# Patient Record
Sex: Male | Born: 1975 | Race: White | Hispanic: No | Marital: Single | Smoking: Current every day smoker
Health system: Southern US, Community
[De-identification: ages and names within clinical notes are randomized; demographics above are authoritative.]

## PROBLEM LIST (undated history)

## (undated) DIAGNOSIS — T7840XA Allergy, unspecified, initial encounter: Secondary | ICD-10-CM

## (undated) DIAGNOSIS — J45909 Unspecified asthma, uncomplicated: Secondary | ICD-10-CM

---

## 1998-11-15 ENCOUNTER — Encounter: Payer: Self-pay | Admitting: Emergency Medicine

## 1998-11-15 ENCOUNTER — Emergency Department (HOSPITAL_COMMUNITY): Admission: EM | Admit: 1998-11-15 | Discharge: 1998-11-15 | Payer: Self-pay | Admitting: Emergency Medicine

## 2002-09-18 ENCOUNTER — Encounter: Payer: Self-pay | Admitting: Emergency Medicine

## 2002-09-18 ENCOUNTER — Emergency Department (HOSPITAL_COMMUNITY): Admission: EM | Admit: 2002-09-18 | Discharge: 2002-09-18 | Payer: Self-pay | Admitting: Emergency Medicine

## 2013-09-30 ENCOUNTER — Other Ambulatory Visit: Payer: Self-pay | Admitting: Family Medicine

## 2013-09-30 ENCOUNTER — Ambulatory Visit
Admission: RE | Admit: 2013-09-30 | Discharge: 2013-09-30 | Disposition: A | Discharge status: Home / Self Care | Payer: BC Managed Care – PPO | Source: Ambulatory Visit | Attending: Family Medicine | Admitting: Family Medicine

## 2013-09-30 DIAGNOSIS — M5432 Sciatica, left side: Secondary | ICD-10-CM

## 2014-06-02 IMAGING — CR DG LUMBAR SPINE COMPLETE 4+V
5 series · 5 of 5 positions shown · non-contrast
Comparison: None.

CLINICAL DATA: Back pain.

EXAM:
LUMBAR SPINE - COMPLETE 4+ VIEW

[t l-spine a.p.]
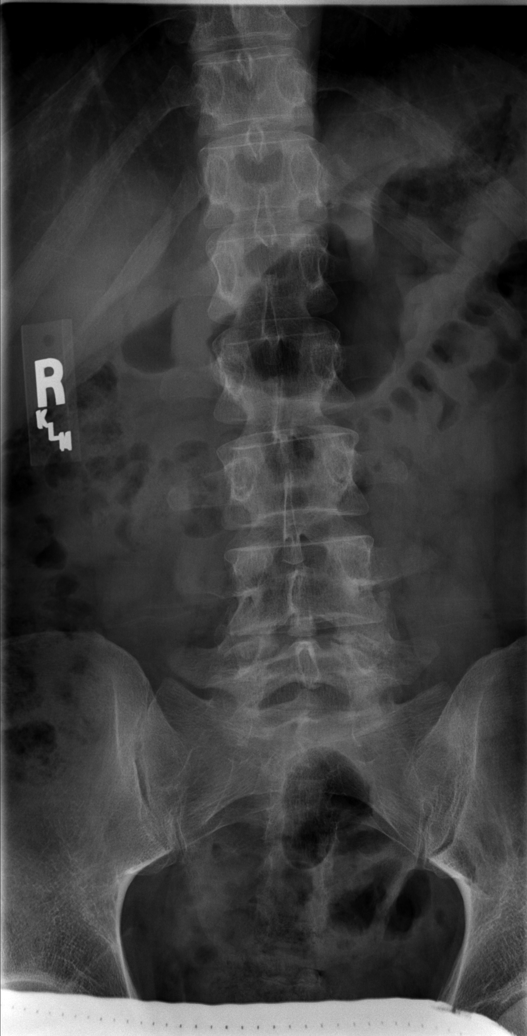

[t l-spine oblique exposure (1 of 2)]
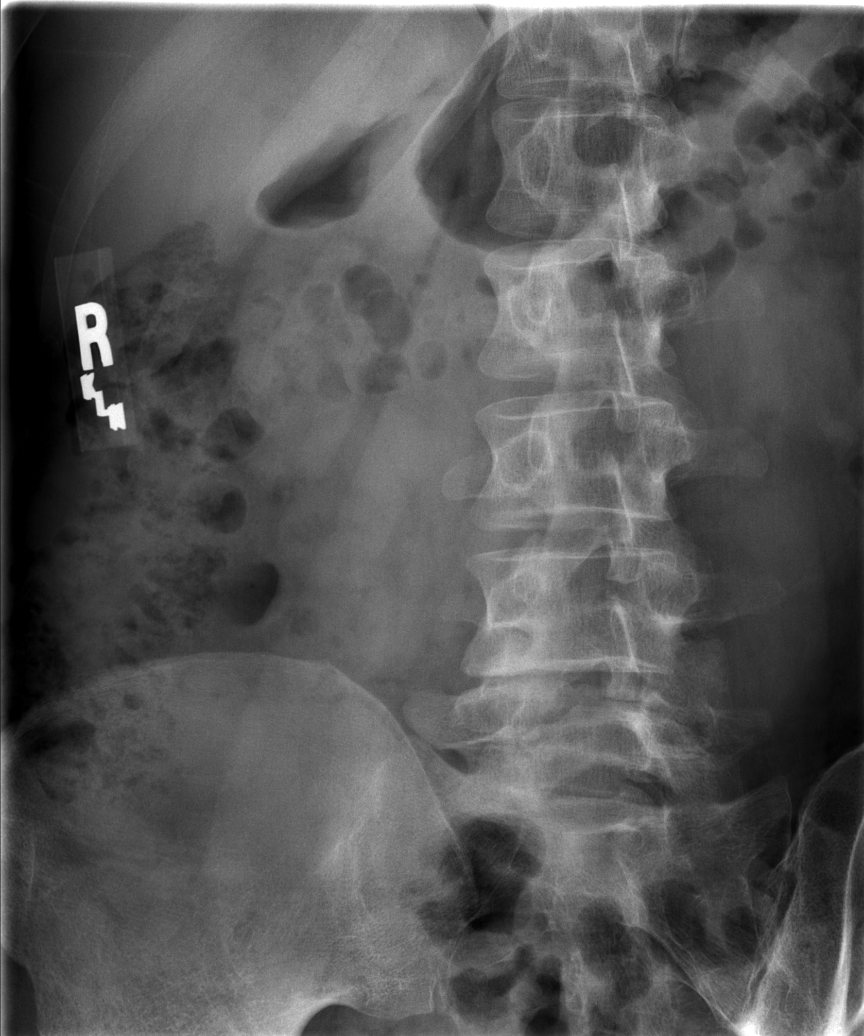

[t l-spine oblique exposure (2 of 2)]
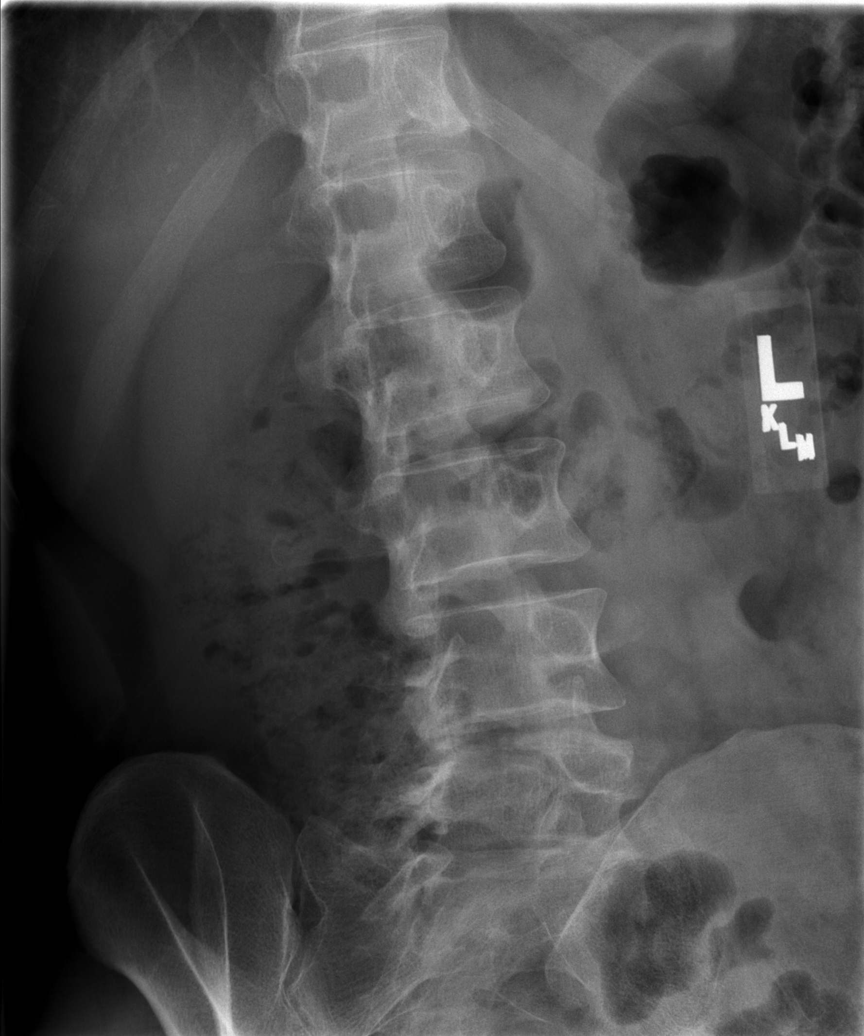

[t l-spine lat]
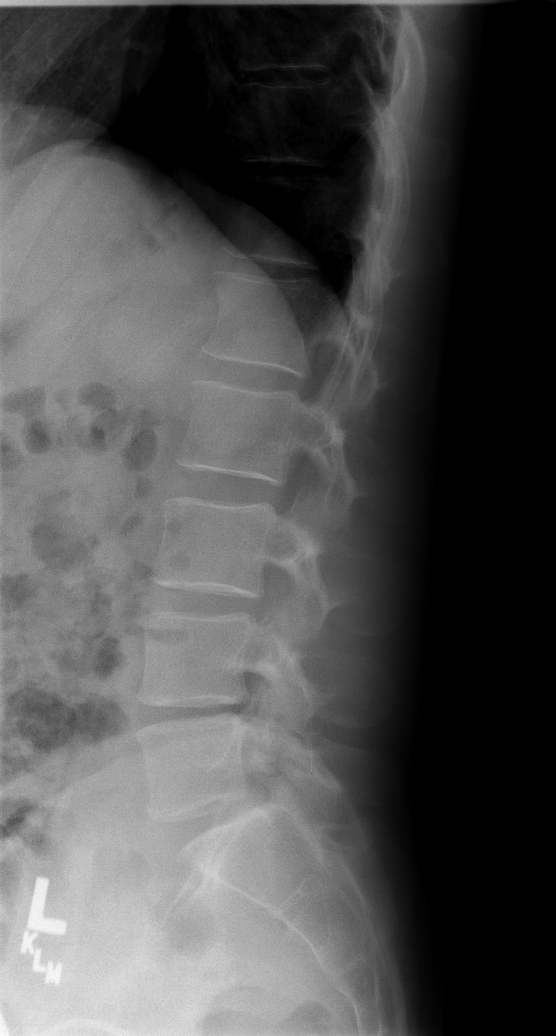

[t l-spine l5-s1 spot]
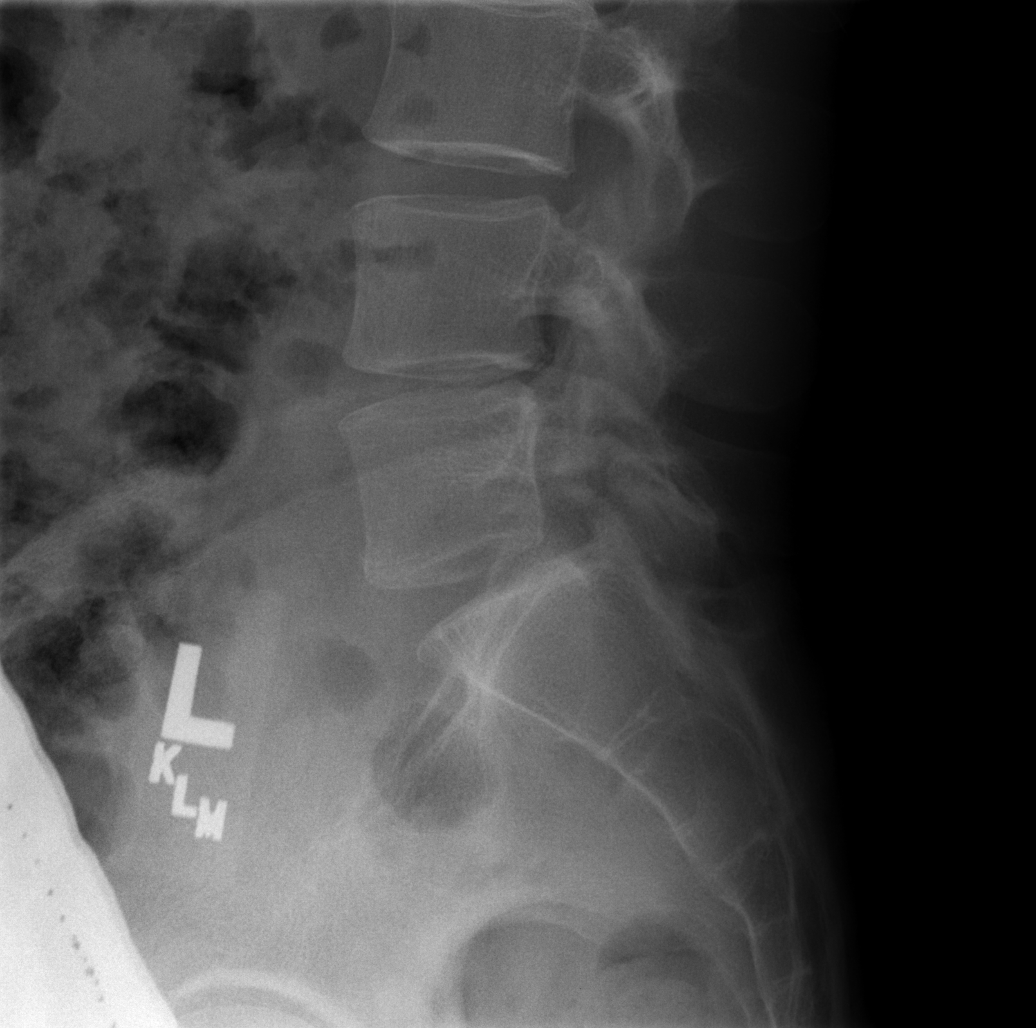

[5 of 5 positions shown; findings below may reference images not displayed]

FINDINGS: Bilateral pars defects are noted at L5 with minimal anterolisthesis.
Mild degenerative disc disease noted at L4-5 and L5-S1. The
remaining lumbar vertebral bodies are normally aligned. No acute
bony findings. The visualized bony pelvis is intact. The SI joints
appear normal.
IMPRESSION: Bilateral pars defects at L5.

No acute bony findings.

## 2014-12-20 ENCOUNTER — Other Ambulatory Visit: Payer: Self-pay | Admitting: Physician Assistant

## 2014-12-20 ENCOUNTER — Ambulatory Visit
Admission: RE | Admit: 2014-12-20 | Discharge: 2014-12-20 | Disposition: A | Discharge status: Home / Self Care | Payer: BLUE CROSS/BLUE SHIELD | Source: Ambulatory Visit | Attending: Physician Assistant | Admitting: Physician Assistant

## 2014-12-20 DIAGNOSIS — T1490XA Injury, unspecified, initial encounter: Secondary | ICD-10-CM

## 2015-05-19 ENCOUNTER — Encounter (HOSPITAL_COMMUNITY): Payer: Self-pay | Admitting: Neurology

## 2015-05-19 ENCOUNTER — Emergency Department (HOSPITAL_COMMUNITY)
Admission: EM | Admit: 2015-05-19 | Discharge: 2015-05-19 | Disposition: A | Discharge status: Home / Self Care | Payer: BLUE CROSS/BLUE SHIELD | Attending: Emergency Medicine | Admitting: Emergency Medicine

## 2015-05-19 DIAGNOSIS — J45909 Unspecified asthma, uncomplicated: Secondary | ICD-10-CM | POA: Diagnosis not present

## 2015-05-19 DIAGNOSIS — J36 Peritonsillar abscess: Secondary | ICD-10-CM | POA: Insufficient documentation

## 2015-05-19 DIAGNOSIS — Z720 Tobacco use: Secondary | ICD-10-CM | POA: Diagnosis not present

## 2015-05-19 DIAGNOSIS — J029 Acute pharyngitis, unspecified: Secondary | ICD-10-CM | POA: Diagnosis present

## 2015-05-19 LAB — BASIC METABOLIC PANEL
Anion gap: 14 (ref 5–15)
BUN: 8 mg/dL (ref 6–20)
CALCIUM: 9 mg/dL (ref 8.9–10.3)
CHLORIDE: 101 mmol/L (ref 101–111)
CO2: 24 mmol/L (ref 22–32)
Creatinine, Ser: 0.99 mg/dL (ref 0.61–1.24)
GFR calc non Af Amer: >60 mL/min (ref >60)
Glucose, Bld: 95 mg/dL (ref 65–99)
POTASSIUM: 3.5 mmol/L (ref 3.5–5.1)
Sodium: 139 mmol/L (ref 135–145)

## 2015-05-19 LAB — CBC WITH DIFFERENTIAL/PLATELET
Basophils Absolute: 0 10*3/uL (ref 0.0–0.1)
Basophils Relative: 0 % (ref 0–1)
Eosinophils Absolute: 0 10*3/uL (ref 0.0–0.7)
Eosinophils Relative: 0 % (ref 0–5)
HCT: 45.4 % (ref 39.0–52.0)
Hemoglobin: 15.4 g/dL (ref 13.0–17.0)
Lymphocytes Relative: 11 % — ABNORMAL LOW (ref 12–46)
Lymphs Abs: 1.6 10*3/uL (ref 0.7–4.0)
MCH: 32 pg (ref 26.0–34.0)
MCHC: 33.9 g/dL (ref 30.0–36.0)
MCV: 94.2 fL (ref 78.0–100.0)
Monocytes Absolute: 1.1 10*3/uL — ABNORMAL HIGH (ref 0.1–1.0)
Monocytes Relative: 8 % (ref 3–12)
Neutro Abs: 11.5 10*3/uL — ABNORMAL HIGH (ref 1.7–7.7)
Neutrophils Relative %: 81 % — ABNORMAL HIGH (ref 43–77)
Platelets: 147 10*3/uL — ABNORMAL LOW (ref 150–400)
RBC: 4.82 MIL/uL (ref 4.22–5.81)
RDW: 14 % (ref 11.5–15.5)
WBC: 14.2 10*3/uL — ABNORMAL HIGH (ref 4.0–10.5)

## 2015-05-19 LAB — RAPID STREP SCREEN (MED CTR MEBANE ONLY): Streptococcus, Group A Screen (Direct): NEGATIVE

## 2015-05-19 MED ORDER — PREDNISONE 50 MG PO TABS: ORAL_TABLET | ORAL | Status: DC

## 2015-05-19 MED ORDER — SODIUM CHLORIDE 0.9 % IV SOLN
3.0000 g | Freq: Once | INTRAVENOUS | Status: AC
  Administered 2015-05-19: 3 g via INTRAVENOUS
  Filled 2015-05-19: qty 3

## 2015-05-19 MED ORDER — SODIUM CHLORIDE 0.9 % IV BOLUS (SEPSIS)
1000.0000 mL | Freq: Once | INTRAVENOUS | Status: AC
  Administered 2015-05-19: 1000 mL via INTRAVENOUS

## 2015-05-19 MED ORDER — HYDROCODONE-ACETAMINOPHEN 5-325 MG PO TABS: ORAL_TABLET | ORAL | Status: DC

## 2015-05-19 MED ORDER — ONDANSETRON HCL 4 MG/2ML IJ SOLN
4.0000 mg | Freq: Once | INTRAMUSCULAR | Status: AC
  Administered 2015-05-19: 4 mg via INTRAVENOUS
  Filled 2015-05-19: qty 2

## 2015-05-19 MED ORDER — MORPHINE SULFATE 4 MG/ML IJ SOLN
4.0000 mg | INTRAMUSCULAR | Status: DC | PRN
  Administered 2015-05-19: 4 mg via INTRAVENOUS
  Filled 2015-05-19: qty 1

## 2015-05-19 MED ORDER — DEXAMETHASONE SODIUM PHOSPHATE 10 MG/ML IJ SOLN
10.0000 mg | Freq: Once | INTRAMUSCULAR | Status: AC
  Administered 2015-05-19: 10 mg via INTRAVENOUS
  Filled 2015-05-19: qty 1

## 2015-05-19 MED ORDER — AMOXICILLIN-POT CLAVULANATE 875-125 MG PO TABS: 1.0000 | ORAL_TABLET | Freq: Two times a day (BID) | ORAL | Status: DC

## 2015-05-19 MED ORDER — MORPHINE SULFATE 4 MG/ML IJ SOLN
4.0000 mg | Freq: Once | INTRAMUSCULAR | Status: AC
  Administered 2015-05-19: 4 mg via INTRAVENOUS
  Filled 2015-05-19: qty 1

## 2015-05-19 MED ORDER — LIDOCAINE-EPINEPHRINE 1 %-1:100000 IJ SOLN
20.0000 mL | Freq: Once | INTRAMUSCULAR | Status: AC
  Administered 2015-05-19: 20 mL
  Filled 2015-05-19: qty 1

## 2015-05-19 NOTE — ED Notes (Signed)
ENT, MD at bedside 

## 2015-05-19 NOTE — ED Provider Notes (Signed)
CSN: [id number]     Arrival date & time 05/19/15  1533 History   None    Chief Complaint  Patient presents with  . Abscess     (Consider location/radiation/quality/duration/timing/severity/associated sxs/prior Treatment) HPI   Blood pressure 118/75, pulse 74, temperature 99.9 F (37.7 C), temperature source Oral, resp. rate 16, SpO2 99 %.  Andrew Lowe is a 39 y.o. male past medical history significant for tobacco use and mild asthma sent from urgent care for evaluation of peritonsillar abscess. Patient had a sore throat with low-grade fever onset 4 days ago. He's had difficulty swallowing food, liquids and his saliva however he is not drooling. Rates his pain at 7 out of 10 at its worse on the right side, it radiates to the right ear. He's taken acetaminophen at home with some relief. States he hasn't really had any solid food today. He denies rhinorrhea, cough, shortness of breath, nausea vomiting, straight frequent strep infections. Mother states that he has a muffled voice is well.  History reviewed. No pertinent past medical history. History reviewed. No pertinent past surgical history. No family history on file. History  Substance Use Topics  . Smoking status: Current Every Day Smoker  . Smokeless tobacco: Not on file  . Alcohol Use: Yes    Review of Systems  10 systems reviewed and found to be negative, except as noted in the HPI.   Allergies  Review of patient's allergies indicates no known allergies.  Home Medications   Prior to Admission medications   Medication Sig Start Date End Date Taking? Authorizing Provider  acetaminophen (TYLENOL) 500 MG tablet Take 500 mg by mouth every 6 (six) hours as needed for mild pain.   Yes Historical Provider, MD  amoxicillin-clavulanate (AUGMENTIN) 875-125 MG per tablet Take 1 tablet by mouth 2 (two) times daily. One tab po bid x 10 days 05/19/15   Joni Reining Axton Cihlar, PA-C  HYDROcodone-acetaminophen (NORCO/VICODIN) 5-325 MG  per tablet Take 1-2 tablets by mouth every 6 hours as needed for pain and/or cough. 05/19/15   Louise Rawson, PA-C  predniSONE (DELTASONE) 50 MG tablet Take 1 tablet daily with breakfast 05/19/15   Joni Reining Mariadelcarmen Corella, PA-C   BP 129/81 mmHg  Pulse 86  Temp(Src) 99.9 F (37.7 C) (Oral)  Resp 16  SpO2 99% Physical Exam  Constitutional: He is oriented to person, place, and time. He appears well-developed and well-nourished. No distress.  Positive hot potato voice.  HENT:  Head: Normocephalic.  Boggy right soft palate, does not rise symmetrically.   Uvula is slightly deviated to the left.   Patient is not drooling, speaking in complete sentences  Eyes: Conjunctivae and EOM are normal.  Cardiovascular: Normal rate.   Pulmonary/Chest: Effort normal. No stridor.  Musculoskeletal: Normal range of motion.  Neurological: He is alert and oriented to person, place, and time.  Psychiatric: He has a normal mood and affect.  Nursing note and vitals reviewed.   ED Course  Procedures (including critical care time) Labs Review Labs Reviewed  CBC WITH DIFFERENTIAL/PLATELET - Abnormal; Notable for the following:    WBC 14.2 (*)    Platelets 147 (*)    Neutrophils Relative % 81 (*)    Neutro Abs 11.5 (*)    Lymphocytes Relative 11 (*)    Monocytes Absolute 1.1 (*)    All other components within normal limits  RAPID STREP SCREEN (NOT AT [facility])  CULTURE, GROUP A STREP  BASIC METABOLIC PANEL    Imaging Review No results found.  EKG Interpretation None      MDM   Final diagnoses:  Peritonsillar abscess    Filed Vitals:   05/19/15 1538 05/19/15 1550 05/19/15 1700 05/19/15 1800  BP: 123/72 118/75 127/67 129/81  Pulse: 84 74 83 86  Temp: 99.9 F (37.7 C)     TempSrc: Oral     Resp: 16 16 16 16   SpO2: 99% 99% 97% 99%    Medications  Ampicillin-Sulbactam (UNASYN) 3 g in sodium chloride 0.9 % 100 mL IVPB (3 g Intravenous New Bag/Given 05/19/15 1802)  morphine 4 MG/ML injection 4 mg  (4 mg Intravenous Given 05/19/15 1800)  sodium chloride 0.9 % bolus 1,000 mL (0 mLs Intravenous Stopped 05/19/15 1700)  dexamethasone (DECADRON) injection 10 mg (10 mg Intravenous Given 05/19/15 1609)  morphine 4 MG/ML injection 4 mg (4 mg Intravenous Given 05/19/15 1609)  ondansetron (ZOFRAN) injection 4 mg (4 mg Intravenous Given 05/19/15 1609)  lidocaine-EPINEPHrine (XYLOCAINE W/EPI) 1 %-1:100000 (with pres) injection 20 mL (20 mLs Other Given 05/19/15 1803)    Andrew Lowe is a pleasant 39 y.o. male presenting with sided peritonsillar abscess. She is spitting out his saliva intermittently but he is not drooling.  ENT consult from Dr Jearld FentonByers appreciated: He recommends starting antibiotics and he will be in to see the patient in about an hour.   Dr. Jearld FentonByers is drained the PTA, states that he does not feel like he got a huge amount of fluid out however the patient reports significant subjective improvement. We'll start this patient on antibiotics and he will follow with Jearld FentonByers in 1 week. I have had an extensive discussion of return precautions with this patient is family, advised soft diet for at least 2 days.  This is a shared visit with the attending physician who personally evaluated the patient and agrees with the care plan.   Evaluation does not show pathology that would require ongoing emergent intervention or inpatient treatment. Pt is hemodynamically stable and mentating appropriately. Discussed findings and plan with patient/guardian, who agrees with care plan. All questions answered. Return precautions discussed and outpatient follow up given.   New Prescriptions   AMOXICILLIN-CLAVULANATE (AUGMENTIN) 875-125 MG PER TABLET    Take 1 tablet by mouth 2 (two) times daily. One tab po bid x 10 days   HYDROCODONE-ACETAMINOPHEN (NORCO/VICODIN) 5-325 MG PER TABLET    Take 1-2 tablets by mouth every 6 hours as needed for pain and/or cough.   PREDNISONE (DELTASONE) 50 MG TABLET    Take 1 tablet daily  with breakfast         Wynetta Emeryicole Emerick Weatherly, PA-C 05/19/15 1817  Blake DivineJohn Wofford, MD 05/19/15 1943

## 2015-05-19 NOTE — ED Notes (Signed)
[facility] Painful to swallow, spitting up mucus, feels like it is draining. Airway intact.

## 2015-05-19 NOTE — ED Provider Notes (Signed)
Medical screening examination/treatment/procedure(s) were conducted as a shared visit with non-physician practitioner(s) and myself.  I personally evaluated the patient during the encounter.   EKG Interpretation None      39 yo male with sore throat for several days, worse today. On exam, well appearing, nontoxic, not distressed, normal respiratory effort, normal perfusion, alert, no respiratory distress, slightly muffled voice, obscured posterior pharyngeal landmarks with deviation to left.  Dr. Jearld FentonByers has come to drained his PTA.    Clinical Impression: 1. Peritonsillar abscess       Blake DivineJohn Kourtlyn Charlet, MD 05/19/15 [phone]

## 2015-05-19 NOTE — Discharge Instructions (Signed)
°  Take your antibiotics as directed and to completion. You should never have any leftover antibiotics! Push fluids and stay well hydrated.   Do not hesitate to return to the Emergency Department for any new, worsening or concerning symptoms.   Take vicodin for breakthrough pain, do not drink alcohol, drive, care for children or do other critical tasks while taking vicodin.     Peritonsillar Abscess A peritonsillar abscess is a collection of pus located in the back of the throat behind the tonsils. It usually occurs when a streptococcal infection of the throat or tonsils spreads into the space around the tonsils. They are almost always caused by the streptococcal germ (bacteria). The treatment of a peritonsillar abscess is most often drainage accomplished by putting a needle into the abscess or cutting (incising) and draining the abscess. This is most often followed with a course of antibiotics. HOME CARE INSTRUCTIONS  If your abscess was drained by your caregiver today, rinse your throat (gargle) with warm salt water four times per day or as needed for comfort. Do not swallow this mixture. Mix 1 teaspoon of salt in 8 ounces of warm water for gargling.  Rest in bed as needed. Resume activities as able.  Apply cold to your neck for pain relief. Fill a plastic bag with ice and wrap it in a towel. Hold the ice on your neck for 20 minutes 4 times per day.  Eat a soft or liquid diet as tolerated while your throat remains sore. Popsicles and ice cream may be good early choices. Drinking plenty of cold fluids will probably be soothing and help take swelling down in between the warm gargles.  Only take over-the-counter or prescription medicines for pain, discomfort, or fever as directed by your caregiver. Do not use aspirin unless directed by your physician. Aspirin slows down the clotting process. It can also cause bleeding from the drainage area if this was needled or incised today.  If antibiotics  were prescribed, take them as directed for the full course of the prescription. Even if you feel you are well, you need to take them. SEEK MEDICAL CARE IF:   You have increased pain, swelling, redness, or drainage in your throat.  You develop signs of infection such as dizziness, headache, lethargy, or generalized feelings of illness.  You have difficulty breathing, swallowing or eating.  You show signs of becoming dehydrated (lightheadedness when standing, decreased urine output, a fast heart rate, or dry mouth and mucous membranes). SEEK IMMEDIATE MEDICAL CARE IF:   You have a fever.  You are coughing up or vomiting blood.  You develop more severe throat pain uncontrolled with medicines or you start to drool.  You develop difficulty breathing, talking, or find it easier to breathe while leaning forward. Document Released: 11/04/2005 Document Revised: 01/27/2012 Document Reviewed: 06/17/2008 [facility] Patient Information 2015 [employer] [place]. This information is not intended to replace advice given to you by your health care provider. Make sure you discuss any questions you have with your health care provider.

## 2015-05-19 NOTE — Consult Note (Signed)
Reason for Consult:PTAReferring Physician: er  [name] is an 39 y.o. male.  HPI: hx of 4 days of sore throat on the right. No previous episodes of tonsillitis. No airway issues.   History reviewed. No pertinent past medical history.  History reviewed. No pertinent past surgical history.  No family history on file.  Social History:  reports that he has been smoking.  He does not have any smokeless tobacco history on file. He reports that he drinks alcohol. His drug history is not on file.  Allergies: No Known Allergies  Medications: I have reviewed the patient's current medications.  Results for orders placed or performed during the hospital encounter of 05/19/15 (from the past 48 hour(s))  CBC with Differential     Status: Abnormal   Collection Time: 05/19/15  3:56 PM  Result Value Ref Range   WBC 14.2 (H) 4.0 - 10.5 K/uL   RBC 4.82 4.22 - 5.81 MIL/uL   Hemoglobin 15.4 13.0 - 17.0 g/dL   HCT 45.4 39.0 - 52.0 %   MCV 94.2 78.0 - 100.0 fL   MCH 32.0 26.0 - 34.0 pg   MCHC 33.9 30.0 - 36.0 g/dL   RDW 14.0 11.5 - 15.5 %   Platelets 147 (L) 150 - 400 K/uL   Neutrophils Relative % 81 (H) 43 - 77 %   Neutro Abs 11.5 (H) 1.7 - 7.7 K/uL   Lymphocytes Relative 11 (L) 12 - 46 %   Lymphs Abs 1.6 0.7 - 4.0 K/uL   Monocytes Relative 8 3 - 12 %   Monocytes Absolute 1.1 (H) 0.1 - 1.0 K/uL   Eosinophils Relative 0 0 - 5 %   Eosinophils Absolute 0.0 0.0 - 0.7 K/uL   Basophils Relative 0 0 - 1 %   Basophils Absolute 0.0 0.0 - 0.1 K/uL  Basic metabolic panel     Status: None   Collection Time: 05/19/15  3:56 PM  Result Value Ref Range   Sodium 139 135 - 145 mmol/L   Potassium 3.5 3.5 - 5.1 mmol/L   Chloride 101 101 - 111 mmol/L   CO2 24 22 - 32 mmol/L   Glucose, Bld 95 65 - 99 mg/dL   BUN 8 6 - 20 mg/dL   Creatinine, Ser 0.99 0.61 - 1.24 mg/dL   Calcium 9.0 8.9 - 10.3 mg/dL   GFR calc non Af Amer >60 >60 mL/min   GFR calc Af Amer >60 >60 mL/min    Comment: (NOTE) The  eGFR has been calculated using the CKD EPI equation. This calculation has not been validated in all clinical situations. eGFR's persistently <60 mL/min signify possible Chronic Kidney Disease.    Anion gap 14 5 - 15  Rapid strep screen     Status: None   Collection Time: 05/19/15  3:56 PM  Result Value Ref Range   Streptococcus, Group A Screen (Direct) NEGATIVE NEGATIVE    Comment: (NOTE) A Rapid Antigen test may result negative if the antigen level in the sample is below the detection level of this test. The FDA has not cleared this test as a stand-alone test therefore the rapid antigen negative result has reflexed to a Group A Strep culture.     No results found.  Review of Systems  Constitutional: Negative.   HENT: Positive for sore throat.   Eyes: Negative.   Respiratory: Negative.   Cardiovascular: Negative.   Skin: Negative.   Neurological: Negative.    Blood pressure 118/75, pulse 74,  temperature 99.9 F (37.7 C), temperature source Oral, resp. rate 16, SpO2 99 %. Physical Exam  Constitutional: He appears well-developed and well-nourished.  HENT:  Head: Normocephalic.  Nose: Nose normal.  Obvious bulging right tonsil to midline and erythema of right soft palate. Also palate bulging. Tongue nl. postpharynx nl.     Assessment/Plan: Right PTA- he has classic finding of PTA. Discussed I/D of right PTA. Risks,benfitsd, and options discussed. All questions answered and consent obtained. Sprayed with cetacaine and injected with 1% lido and epi. Incision made with 11 blade and tonsil hemastat opened peritonsillar space. Small amount of pus and he tolerated well. Will give Unasyn and he already had decadron by ER. Will d/c to home on Augmentin and f/u in one week sooner if not better in 24 hours  Andrew Lowe, [facility] 05/19/2015, 5:43 PM

## 2015-05-22 LAB — CULTURE, GROUP A STREP: Strep A Culture: NEGATIVE

## 2015-08-22 IMAGING — CR DG FOOT COMPLETE 3+V*L*
3 series · 3 of 3 positions shown · non-contrast
Comparison: None.

CLINICAL DATA: Seeding injury of the left foot with pain over the
second through fourth toes and cutaneous bruising

EXAM:
LEFT FOOT - COMPLETE 3+ VIEW

[t foot ap left]
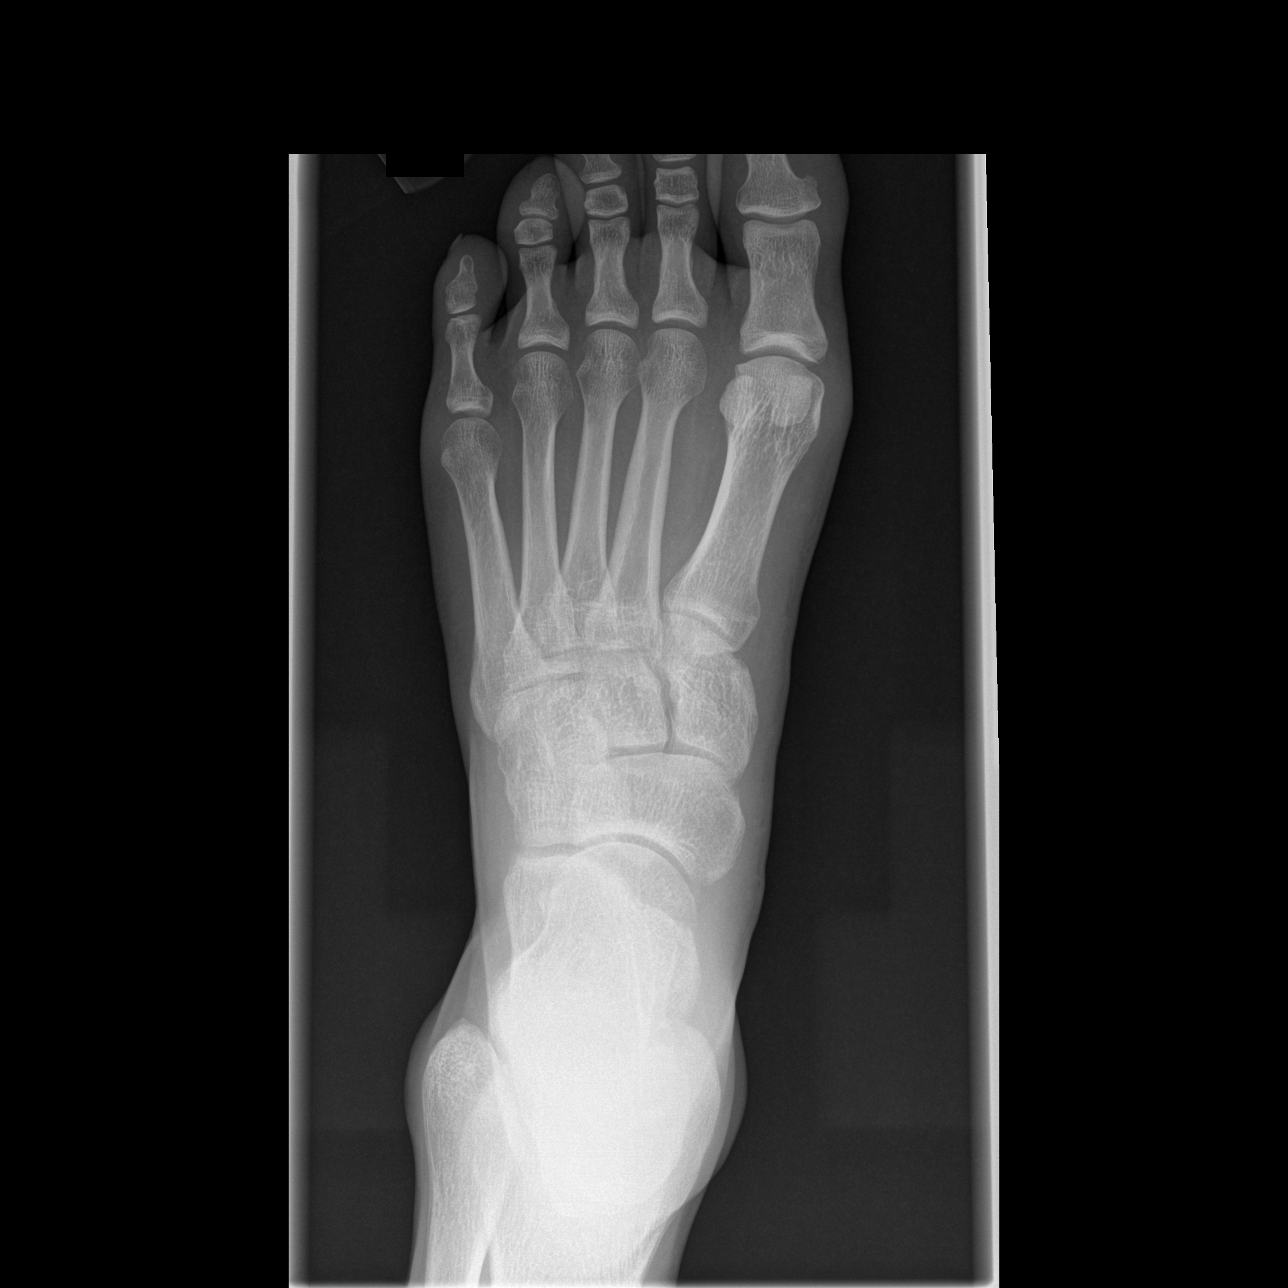

[t foot oblique left]
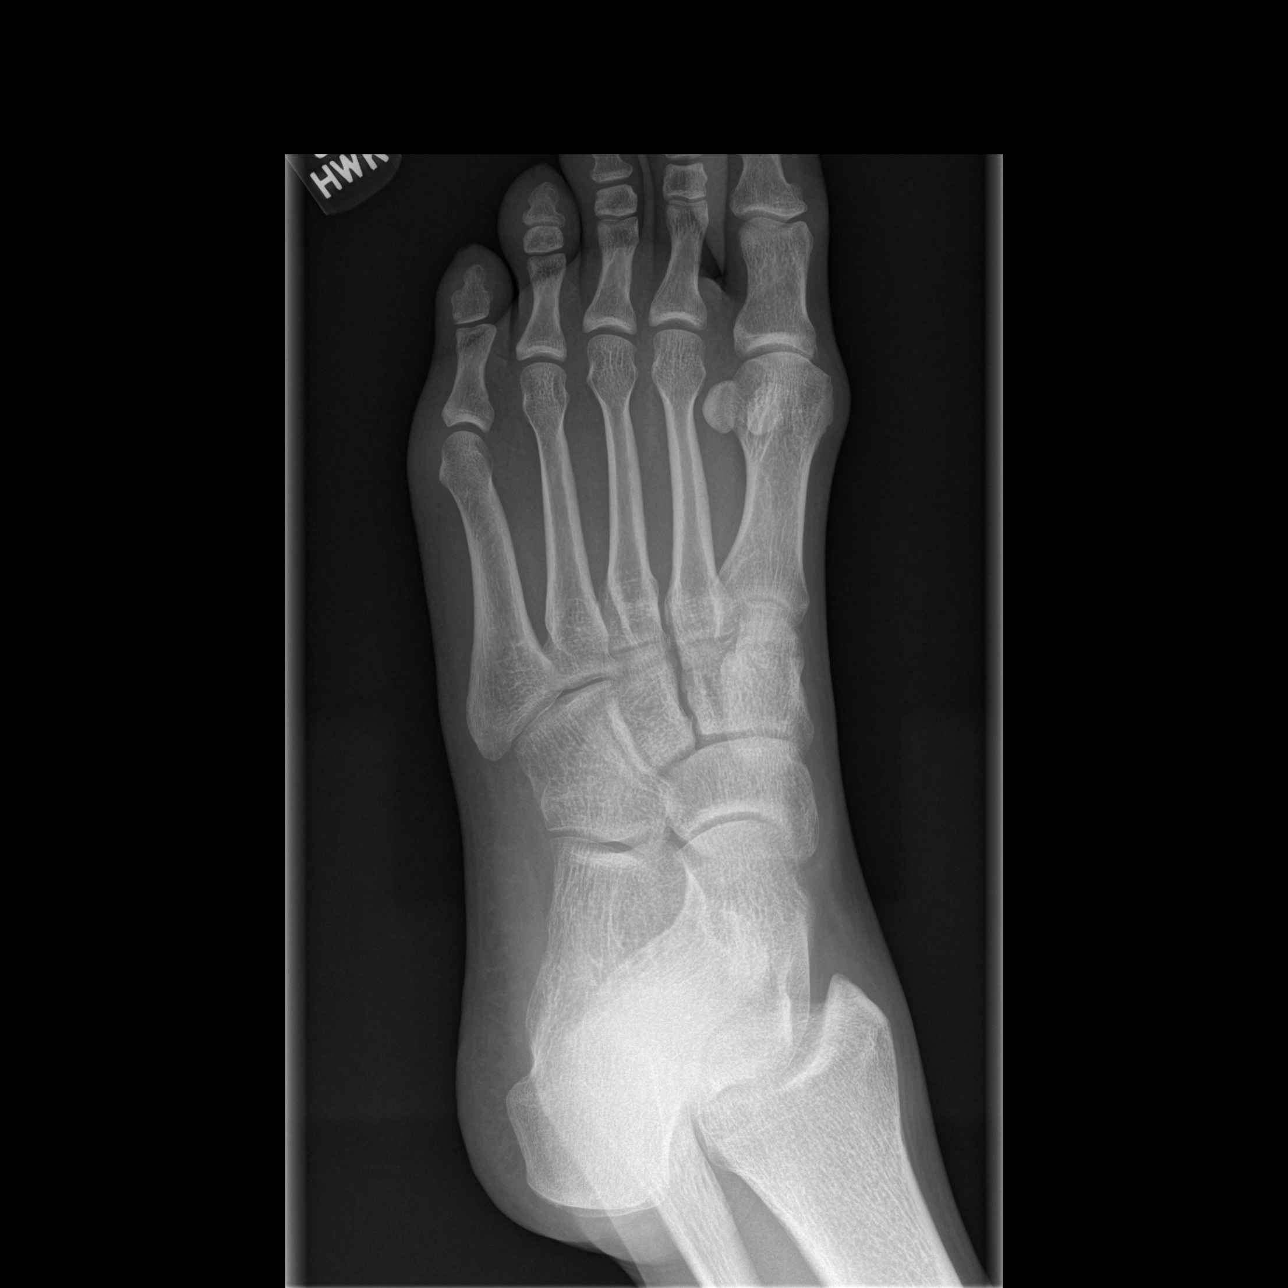

[t foot lat left]
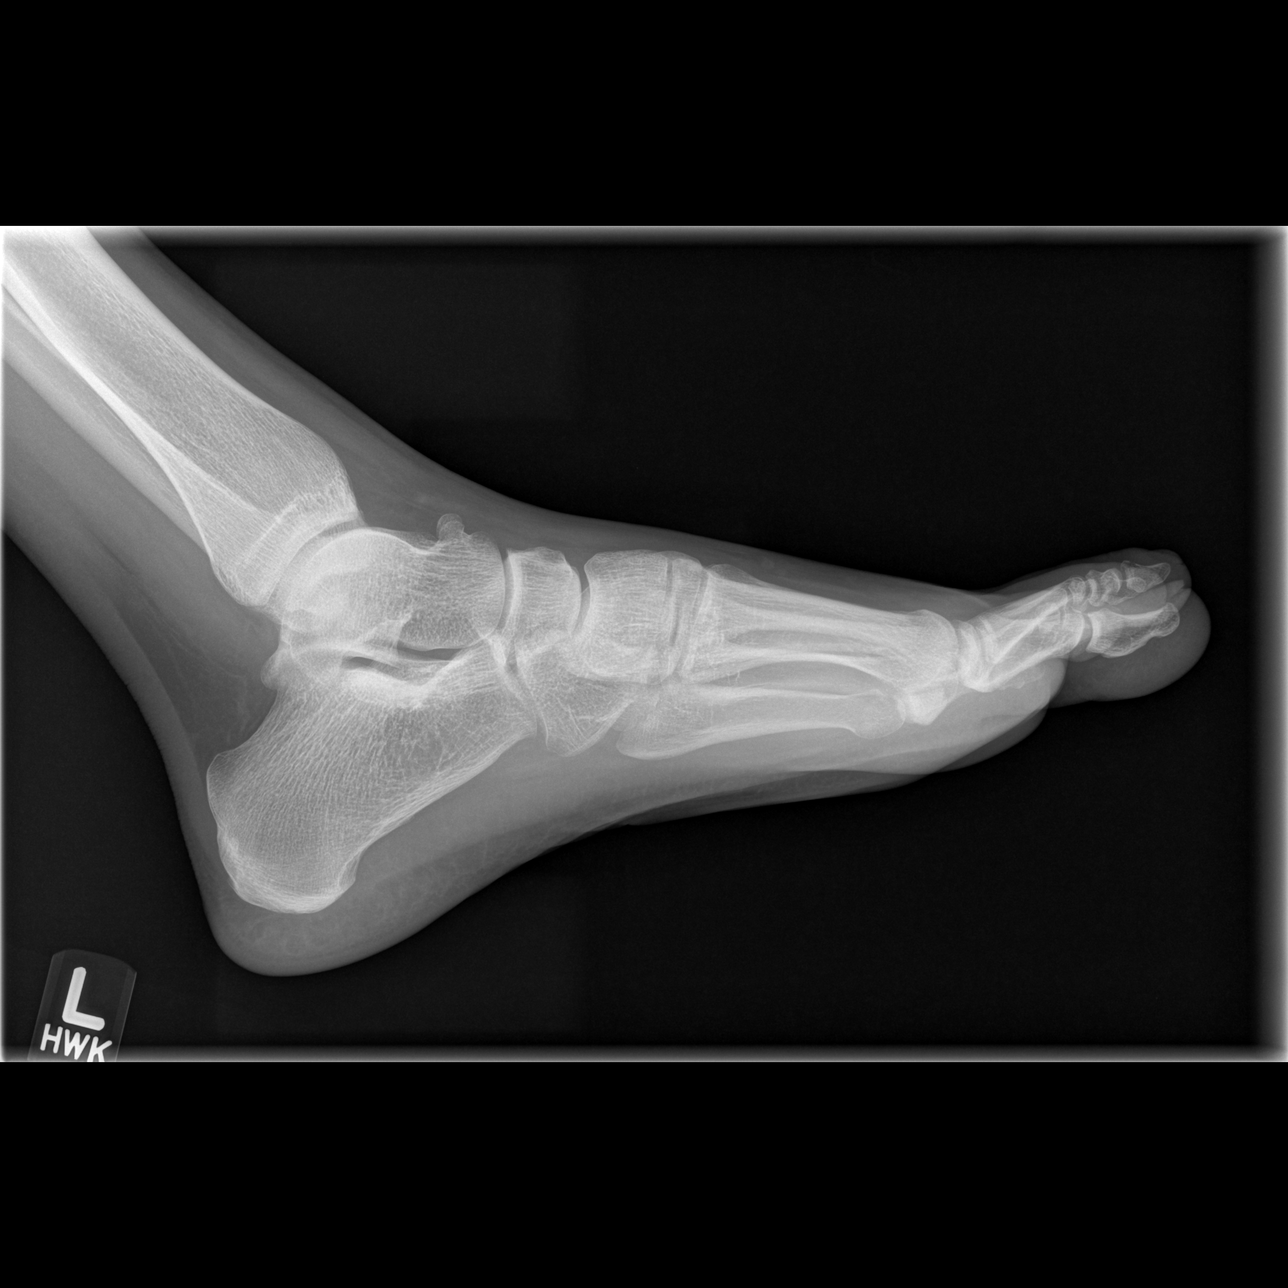

[3 of 3 positions shown; findings below may reference images not displayed]

FINDINGS: The bones of the toes are adequately mineralized. There is an acute
fracture through the dorsum of the base of the distal phalanx of the
second toe. This is visible on the lateral view only. The
metatarsals are intact. The bones of the hindfoot also are intact.
IMPRESSION: There is a subtle fracture through the dorsum of the base of the
distal phalanx of the left second toe.

## 2017-03-06 ENCOUNTER — Emergency Department (HOSPITAL_COMMUNITY)
Admission: EM | Admit: 2017-03-06 | Discharge: 2017-03-06 | Disposition: A | Discharge status: Home / Self Care | Payer: No Typology Code available for payment source | Attending: Emergency Medicine | Admitting: Emergency Medicine

## 2017-03-06 ENCOUNTER — Encounter (HOSPITAL_COMMUNITY): Payer: Self-pay

## 2017-03-06 DIAGNOSIS — R197 Diarrhea, unspecified: Secondary | ICD-10-CM | POA: Diagnosis not present

## 2017-03-06 DIAGNOSIS — F17210 Nicotine dependence, cigarettes, uncomplicated: Secondary | ICD-10-CM | POA: Diagnosis not present

## 2017-03-06 DIAGNOSIS — J45909 Unspecified asthma, uncomplicated: Secondary | ICD-10-CM | POA: Insufficient documentation

## 2017-03-06 DIAGNOSIS — E876 Hypokalemia: Secondary | ICD-10-CM | POA: Insufficient documentation

## 2017-03-06 DIAGNOSIS — T5491XA Toxic effect of unspecified corrosive substance, accidental (unintentional), initial encounter: Secondary | ICD-10-CM | POA: Insufficient documentation

## 2017-03-06 DIAGNOSIS — R112 Nausea with vomiting, unspecified: Secondary | ICD-10-CM | POA: Insufficient documentation

## 2017-03-06 DIAGNOSIS — T6591XA Toxic effect of unspecified substance, accidental (unintentional), initial encounter: Secondary | ICD-10-CM

## 2017-03-06 HISTORY — DX: Unspecified asthma, uncomplicated: J45.909

## 2017-03-06 HISTORY — DX: Allergy, unspecified, initial encounter: T78.40XA

## 2017-03-06 LAB — COMPREHENSIVE METABOLIC PANEL
ALK PHOS: 51 U/L (ref 38–126)
ALT: 13 U/L — ABNORMAL LOW (ref 17–63)
ANION GAP: 12 (ref 5–15)
AST: 15 U/L (ref 15–41)
Albumin: 4.8 g/dL (ref 3.5–5.0)
BUN: 8 mg/dL (ref 6–20)
CO2: 24 mmol/L (ref 22–32)
Calcium: 8.9 mg/dL (ref 8.9–10.3)
Chloride: 103 mmol/L (ref 101–111)
Creatinine, Ser: 0.93 mg/dL (ref 0.61–1.24)
GFR calc Af Amer: >60 mL/min (ref >60)
GFR calc non Af Amer: >60 mL/min (ref >60)
Glucose, Bld: 94 mg/dL (ref 65–99)
Potassium: 3.3 mmol/L — ABNORMAL LOW (ref 3.5–5.1)
SODIUM: 139 mmol/L (ref 135–145)
Total Bilirubin: 1.6 mg/dL — ABNORMAL HIGH (ref 0.3–1.2)
Total Protein: 7.2 g/dL (ref 6.5–8.1)

## 2017-03-06 LAB — CBC
HCT: 46.7 % (ref 39.0–52.0)
Hemoglobin: 15.9 g/dL (ref 13.0–17.0)
MCH: 32.4 pg (ref 26.0–34.0)
MCHC: 34 g/dL (ref 30.0–36.0)
MCV: 95.3 fL (ref 78.0–100.0)
Platelets: 172 10*3/uL (ref 150–400)
RBC: 4.9 MIL/uL (ref 4.22–5.81)
RDW: 15 % (ref 11.5–15.5)
WBC: 8.8 10*3/uL (ref 4.0–10.5)

## 2017-03-06 MED ORDER — GI COCKTAIL ~~LOC~~
30.0000 mL | Freq: Once | ORAL | Status: AC
  Administered 2017-03-06: 30 mL via ORAL
  Filled 2017-03-06: qty 30

## 2017-03-06 MED ORDER — SODIUM CHLORIDE 0.9 % IV BOLUS (SEPSIS)
2000.0000 mL | Freq: Once | INTRAVENOUS | Status: AC
  Administered 2017-03-06: 2000 mL via INTRAVENOUS

## 2017-03-06 MED ORDER — METOCLOPRAMIDE HCL 10 MG PO TABS: 10.0000 mg | ORAL_TABLET | Freq: Four times a day (QID) | ORAL | 0 refills | Status: AC | PRN

## 2017-03-06 MED ORDER — POTASSIUM CHLORIDE CRYS ER 20 MEQ PO TBCR
40.0000 meq | EXTENDED_RELEASE_TABLET | Freq: Once | ORAL | Status: AC
  Administered 2017-03-06: 40 meq via ORAL
  Filled 2017-03-06: qty 2

## 2017-03-06 NOTE — ED Notes (Addendum)
Poison Control recommends NPO x several hours, monitor for difficulty swallowing. Persistent symptoms may require EGD.  PO challenge with water in 1.5 hours.  Risk for scarring, strictures, and esophageal cancer in long term.   Poison Control plans to return call in a few hours for update and further advice.

## 2017-03-06 NOTE — ED Provider Notes (Signed)
[facility]-EMERGENCY DEPT Provider Note   CSN: [id number] Arrival date & time: 03/06/17  1907     History   Chief Complaint Chief Complaint  Patient presents with  . Poisoning    BLEACH    HPI Andrew Lowe is a 41 y.o. male.Patient has had 3 or 4 episodes of vomiting and approximate 8 episodes of diarrhea onset this morning. At 4:30 PM today he reached for what he thought was a couple of water and states he swallowed "a shot" of household bleach concentrate, [employer] brand, by accident. He complains of a mild burning in his chest and throat since the event. He's had no further nausea or vomiting. Denies dyspnea. No treatment prior to coming here. No other associated symptoms.  HPI  Past Medical History:  Diagnosis Date  . Allergy    SEASONAL  . Asthma     There are no active problems to display for this patient.   History reviewed. No pertinent surgical history.     Home Medications    Prior to Admission medications   Medication Sig Start Date End Date Taking? Authorizing Provider  acetaminophen (TYLENOL) 500 MG tablet Take 500 mg by mouth every 6 (six) hours as needed for mild pain.    Historical Provider, MD  amoxicillin-clavulanate (AUGMENTIN) 875-125 MG per tablet Take 1 tablet by mouth 2 (two) times daily. One tab po bid x 10 days 05/19/15   Joni Reining Pisciotta, PA-C  HYDROcodone-acetaminophen (NORCO/VICODIN) 5-325 MG per tablet Take 1-2 tablets by mouth every 6 hours as needed for pain and/or cough. 05/19/15   Nicole Pisciotta, PA-C  predniSONE (DELTASONE) 50 MG tablet Take 1 tablet daily with breakfast 05/19/15   Wynetta Emery, PA-C    Family History History reviewed. No pertinent family history.  Social History Social History  Substance Use Topics  . Smoking status: Current Every Day Smoker    Packs/day: 0.50    Types: Cigarettes  . Smokeless tobacco: Never Used  . Alcohol use Yes     Allergies   Patient has no known allergies.   Review of  Systems Review of Systems  Constitutional: Negative.   HENT: Positive for sore throat. Negative for voice change.   Respiratory: Negative.   Cardiovascular: Negative.   Gastrointestinal: Positive for diarrhea and vomiting.  Musculoskeletal: Negative.   Skin: Negative.   Neurological: Negative.   Psychiatric/Behavioral: Negative.   All other systems reviewed and are negative.    Physical Exam Updated Vital Signs BP 127/88 (BP Location: Right Arm)   Pulse 70   Temp 98.4 F (36.9 C) (Oral)   Resp 16   Ht  (1.905 m)   Wt 126 lb (57.2 kg)   SpO2 98%   BMI 15.75 kg/m   Physical Exam  Constitutional: He appears well-developed and well-nourished.  HENT:  Head: Normocephalic and atraumatic.  No mucosal lesion. No hoarseness  Eyes: Conjunctivae are normal. Pupils are equal, round, and reactive to light.  Neck: Neck supple. No tracheal deviation present. No thyromegaly present.  Cardiovascular: Normal rate and regular rhythm.   No murmur heard. Pulmonary/Chest: Effort normal and breath sounds normal.  Abdominal: Soft. Bowel sounds are normal. He exhibits no distension. There is no tenderness.  Musculoskeletal: Normal range of motion. He exhibits no edema or tenderness.  Neurological: He is alert. Coordination normal.  Skin: Skin is warm and dry. No rash noted.  Psychiatric: He has a normal mood and affect.  Nursing note and vitals reviewed.  ED Treatments / Results  Labs (all labs ordered are listed, but only abnormal results are displayed) Labs Reviewed  COMPREHENSIVE METABOLIC PANEL  CBC    EKG  EKG Interpretation None       Radiology No results found.  Procedures Procedures (including critical care time)  Medications Ordered in ED Medications  sodium chloride 0.9 % bolus 2,000 mL (2,000 mLs Intravenous New Bag/Given 03/06/17 1958)   Results for orders placed or performed during the hospital encounter of 03/06/17  Comprehensive metabolic panel    Result Value Ref Range   Sodium 139 135 - 145 mmol/L   Potassium 3.3 (L) 3.5 - 5.1 mmol/L   Chloride 103 101 - 111 mmol/L   CO2 24 22 - 32 mmol/L   Glucose, Bld 94 65 - 99 mg/dL   BUN 8 6 - 20 mg/dL   Creatinine, Ser 4.09 0.61 - 1.24 mg/dL   Calcium 8.9 8.9 - 81.1 mg/dL   Total Protein 7.2 6.5 - 8.1 g/dL   Albumin 4.8 3.5 - 5.0 g/dL   AST 15 15 - 41 U/L   ALT 13 (L) 17 - 63 U/L   Alkaline Phosphatase 51 38 - 126 U/L   Total Bilirubin 1.6 (H) 0.3 - 1.2 mg/dL   GFR calc non Af Amer >60 >60 mL/min   GFR calc Af Amer >60 >60 mL/min   Anion gap 12 5 - 15  CBC  Result Value Ref Range   WBC 8.8 4.0 - 10.5 K/uL   RBC 4.90 4.22 - 5.81 MIL/uL   Hemoglobin 15.9 13.0 - 17.0 g/dL   HCT 91.4 78.2 - 95.6 %   MCV 95.3 78.0 - 100.0 fL   MCH 32.4 26.0 - 34.0 pg   MCHC 34.0 30.0 - 36.0 g/dL   RDW 21.3 08.6 - 57.8 %   Platelets 172 150 - 400 K/uL   No results found.  Initial Impression / Assessment and Plan / ED Course  I have reviewed the triage vital signs and the nursing notes.  Pertinent labs & imaging results that were available during my care of the patient were reviewed by me and considered in my medical decision making (see chart for details).   9:25 PM complained of mild burning in epigastrium. GI cocktail ordered. At 10:45 PM he feels much improved and ready to go home. After treatment with GI cocktail and intravenous hydration He is able to drink water without difficulty. He'll be administered potassium chloride 40 mEq prior to discharge. Vomiting and diarrhea from earlier today felt to be secondary to gastroenteritis. Ingestion of bleach was accidental and nontoxic  Plan prescription Reglan. Imodium for diarrhea. Encourage oral hydration. Referral  to primary care  Final Clinical Impressions(s) / ED Diagnoses  Diagnosis #1 nontoxic accidental ingestion #2 nausea vomiting diarrhea #3 hypokalemia Final diagnoses:  None    New Prescriptions New Prescriptions   No medications  on file     Doug Sou, MD 03/06/17 2248

## 2017-03-06 NOTE — Discharge Instructions (Signed)
Take the medication prescribed as needed for nausea. Take Imodium as directed for diarrhea. Avoid no foods containing milk such as cheese or is having diarrhea.Make sure that you drink at least six 8 ounce glasses of water or Gatorade each day in order to stay well-hydrated. Call any of the numbers on these instructions to get a primary care physician. Return if your unable to hold down fluids without vomiting after taking the medication prescribed or if your condition worsens for any reason

## 2017-03-06 NOTE — ED Triage Notes (Signed)
PT STS HE ACCIDENTALLY DRANK 1.5 CUPS OF BLEACH ABOUT AN HOUR AGO. PT STS HE WAS GETTING READY TO CLEAN HIS SHIRT, WHEN HE DRANK THE BLEACH. AFTERWARDS, HE SPIT UP A LITTLE,A ND REALIZED IT WAS NO THIS CUP OF WATER. PT C/O BURNING TO THE THROAT AND STOMACH. PT STS HE HAS HAD N/V/D SINCE THIS MORNING, AS WELL. NO N/V AT THIS TIME.

## 2017-03-06 NOTE — ED Notes (Signed)
THE PT WAS ABLE TO SWALLOW 2 CUPS OF WATER W/O ANY DIFFICULTIES.
